# Patient Record
Sex: Male | Born: 1955 | Race: White | Hispanic: No | Marital: Married | Smoking: Former smoker
Health system: Southern US, Community
[De-identification: ages and names within clinical notes are randomized; demographics above are authoritative.]

## PROBLEM LIST (undated history)

## (undated) DIAGNOSIS — L409 Psoriasis, unspecified: Secondary | ICD-10-CM

## (undated) DIAGNOSIS — F419 Anxiety disorder, unspecified: Secondary | ICD-10-CM

## (undated) DIAGNOSIS — R7611 Nonspecific reaction to tuberculin skin test without active tuberculosis: Secondary | ICD-10-CM

## (undated) DIAGNOSIS — I10 Essential (primary) hypertension: Secondary | ICD-10-CM

## (undated) DIAGNOSIS — E7800 Pure hypercholesterolemia, unspecified: Secondary | ICD-10-CM

## (undated) DIAGNOSIS — K219 Gastro-esophageal reflux disease without esophagitis: Secondary | ICD-10-CM

## (undated) DIAGNOSIS — A159 Respiratory tuberculosis unspecified: Secondary | ICD-10-CM

## (undated) HISTORY — PX: VASECTOMY: SHX75

## (undated) HISTORY — PX: TONSILLECTOMY: SUR1361

## (undated) HISTORY — PX: ORAL MUCOCELE EXCISION: SHX2111

## (undated) HISTORY — PX: PILONIDAL CYST EXCISION: SHX744

---

## 2003-10-24 ENCOUNTER — Other Ambulatory Visit: Payer: Self-pay

## 2007-05-07 ENCOUNTER — Ambulatory Visit: Payer: Self-pay | Admitting: Gastroenterology

## 2014-09-04 ENCOUNTER — Other Ambulatory Visit: Payer: Self-pay | Admitting: Surgery

## 2014-09-04 DIAGNOSIS — M25561 Pain in right knee: Secondary | ICD-10-CM

## 2014-09-04 DIAGNOSIS — S83231A Complex tear of medial meniscus, current injury, right knee, initial encounter: Secondary | ICD-10-CM

## 2014-09-07 ENCOUNTER — Ambulatory Visit
Admission: RE | Admit: 2014-09-07 | Discharge: 2014-09-07 | Disposition: A | Discharge status: Home / Self Care | Payer: BC Managed Care – PPO | Source: Ambulatory Visit | Attending: Surgery | Admitting: Surgery

## 2014-09-07 DIAGNOSIS — R609 Edema, unspecified: Secondary | ICD-10-CM | POA: Diagnosis not present

## 2014-09-07 DIAGNOSIS — S83231A Complex tear of medial meniscus, current injury, right knee, initial encounter: Secondary | ICD-10-CM

## 2014-09-07 DIAGNOSIS — M25561 Pain in right knee: Secondary | ICD-10-CM | POA: Diagnosis present

## 2014-09-07 DIAGNOSIS — M1711 Unilateral primary osteoarthritis, right knee: Secondary | ICD-10-CM | POA: Diagnosis not present

## 2014-09-07 DIAGNOSIS — M23321 Other meniscus derangements, posterior horn of medial meniscus, right knee: Secondary | ICD-10-CM | POA: Insufficient documentation

## 2014-09-20 ENCOUNTER — Encounter: Payer: Self-pay | Admitting: *Deleted

## 2014-09-27 ENCOUNTER — Ambulatory Visit: Payer: BC Managed Care – PPO | Admitting: Anesthesiology

## 2014-09-27 ENCOUNTER — Ambulatory Visit
Admission: RE | Admit: 2014-09-27 | Discharge: 2014-09-27 | Disposition: A | Discharge status: Home / Self Care | Payer: BC Managed Care – PPO | Source: Ambulatory Visit | Attending: Surgery | Admitting: Surgery

## 2014-09-27 ENCOUNTER — Encounter: Payer: Self-pay | Admitting: *Deleted

## 2014-09-27 ENCOUNTER — Encounter
Admission: RE | Disposition: A | Discharge status: Home / Self Care | Payer: Self-pay | Source: Ambulatory Visit | Attending: Surgery

## 2014-09-27 DIAGNOSIS — F419 Anxiety disorder, unspecified: Secondary | ICD-10-CM | POA: Diagnosis not present

## 2014-09-27 DIAGNOSIS — K219 Gastro-esophageal reflux disease without esophagitis: Secondary | ICD-10-CM | POA: Insufficient documentation

## 2014-09-27 DIAGNOSIS — Z87891 Personal history of nicotine dependence: Secondary | ICD-10-CM | POA: Diagnosis not present

## 2014-09-27 DIAGNOSIS — M23203 Derangement of unspecified medial meniscus due to old tear or injury, right knee: Secondary | ICD-10-CM | POA: Diagnosis present

## 2014-09-27 DIAGNOSIS — M23221 Derangement of posterior horn of medial meniscus due to old tear or injury, right knee: Secondary | ICD-10-CM | POA: Diagnosis not present

## 2014-09-27 DIAGNOSIS — Z833 Family history of diabetes mellitus: Secondary | ICD-10-CM | POA: Insufficient documentation

## 2014-09-27 DIAGNOSIS — Z8349 Family history of other endocrine, nutritional and metabolic diseases: Secondary | ICD-10-CM | POA: Diagnosis not present

## 2014-09-27 DIAGNOSIS — Z818 Family history of other mental and behavioral disorders: Secondary | ICD-10-CM | POA: Diagnosis not present

## 2014-09-27 DIAGNOSIS — I10 Essential (primary) hypertension: Secondary | ICD-10-CM | POA: Insufficient documentation

## 2014-09-27 DIAGNOSIS — E785 Hyperlipidemia, unspecified: Secondary | ICD-10-CM | POA: Diagnosis not present

## 2014-09-27 DIAGNOSIS — Z825 Family history of asthma and other chronic lower respiratory diseases: Secondary | ICD-10-CM | POA: Diagnosis not present

## 2014-09-27 DIAGNOSIS — Z8249 Family history of ischemic heart disease and other diseases of the circulatory system: Secondary | ICD-10-CM | POA: Diagnosis not present

## 2014-09-27 DIAGNOSIS — Z79899 Other long term (current) drug therapy: Secondary | ICD-10-CM | POA: Diagnosis not present

## 2014-09-27 HISTORY — DX: Anxiety disorder, unspecified: F41.9

## 2014-09-27 HISTORY — DX: Nonspecific reaction to tuberculin skin test without active tuberculosis: R76.11

## 2014-09-27 HISTORY — PX: KNEE ARTHROSCOPY: SHX127

## 2014-09-27 HISTORY — DX: Essential (primary) hypertension: I10

## 2014-09-27 HISTORY — DX: Gastro-esophageal reflux disease without esophagitis: K21.9

## 2014-09-27 HISTORY — DX: Pure hypercholesterolemia, unspecified: E78.00

## 2014-09-27 SURGERY — ARTHROSCOPY, KNEE
Anesthesia: General | Laterality: Right | Wound class: Clean

## 2014-09-27 MED ORDER — OXYCODONE HCL 5 MG PO TABS: 5.0000 mg | ORAL_TABLET | ORAL | Status: DC | PRN

## 2014-09-27 MED ORDER — FENTANYL CITRATE (PF) 100 MCG/2ML IJ SOLN
INTRAMUSCULAR | Status: DC | PRN
  Administered 2014-09-27: 50 ug via INTRAVENOUS

## 2014-09-27 MED ORDER — ONDANSETRON HCL 4 MG/2ML IJ SOLN: 4.0000 mg | Freq: Once | INTRAMUSCULAR | Status: DC | PRN

## 2014-09-27 MED ORDER — OXYCODONE HCL 5 MG/5ML PO SOLN: 5.0000 mg | Freq: Once | ORAL | Status: DC | PRN

## 2014-09-27 MED ORDER — POTASSIUM CHLORIDE IN NACL 20-0.9 MEQ/L-% IV SOLN: INTRAVENOUS | Status: DC

## 2014-09-27 MED ORDER — ONDANSETRON HCL 4 MG/2ML IJ SOLN: 4.0000 mg | Freq: Four times a day (QID) | INTRAMUSCULAR | Status: DC | PRN

## 2014-09-27 MED ORDER — HYDROMORPHONE HCL 1 MG/ML IJ SOLN: 0.2500 mg | INTRAMUSCULAR | Status: DC | PRN

## 2014-09-27 MED ORDER — METOCLOPRAMIDE HCL 5 MG PO TABS: 5.0000 mg | ORAL_TABLET | Freq: Three times a day (TID) | ORAL | Status: DC | PRN

## 2014-09-27 MED ORDER — METOCLOPRAMIDE HCL 5 MG/ML IJ SOLN: 5.0000 mg | Freq: Three times a day (TID) | INTRAMUSCULAR | Status: DC | PRN

## 2014-09-27 MED ORDER — PROPOFOL 10 MG/ML IV BOLUS
INTRAVENOUS | Status: DC | PRN
  Administered 2014-09-27: 200 mg via INTRAVENOUS

## 2014-09-27 MED ORDER — LIDOCAINE HCL 1 % IJ SOLN
INTRAMUSCULAR | Status: DC | PRN
  Administered 2014-09-27: 60 mL via INTRAMUSCULAR

## 2014-09-27 MED ORDER — ONDANSETRON HCL 4 MG/2ML IJ SOLN
INTRAMUSCULAR | Status: DC | PRN
  Administered 2014-09-27: 4 mg via INTRAVENOUS

## 2014-09-27 MED ORDER — BUPIVACAINE-EPINEPHRINE (PF) 0.5% -1:200000 IJ SOLN
INTRAMUSCULAR | Status: DC | PRN
  Administered 2014-09-27: 12 mL

## 2014-09-27 MED ORDER — OXYCODONE HCL 5 MG PO TABS: 5.0000 mg | ORAL_TABLET | Freq: Once | ORAL | Status: DC | PRN

## 2014-09-27 MED ORDER — LIDOCAINE HCL (CARDIAC) 20 MG/ML IV SOLN
INTRAVENOUS | Status: DC | PRN
  Administered 2014-09-27: 40 mg via INTRATRACHEAL

## 2014-09-27 MED ORDER — LACTATED RINGERS IV SOLN
INTRAVENOUS | Status: DC
  Administered 2014-09-27: 14:00:00 via INTRAVENOUS

## 2014-09-27 MED ORDER — CEFAZOLIN SODIUM-DEXTROSE 2-3 GM-% IV SOLR
2.0000 g | Freq: Once | INTRAVENOUS | Status: AC
  Administered 2014-09-27: 2 g via INTRAVENOUS

## 2014-09-27 MED ORDER — ONDANSETRON HCL 4 MG PO TABS: 4.0000 mg | ORAL_TABLET | Freq: Four times a day (QID) | ORAL | Status: DC | PRN

## 2014-09-27 MED ORDER — DEXAMETHASONE SODIUM PHOSPHATE 4 MG/ML IJ SOLN
INTRAMUSCULAR | Status: DC | PRN
  Administered 2014-09-27: 8 mg via INTRAVENOUS

## 2014-09-27 MED ORDER — GLYCOPYRROLATE 0.2 MG/ML IJ SOLN
INTRAMUSCULAR | Status: DC | PRN
  Administered 2014-09-27: 0.2 mg via INTRAVENOUS

## 2014-09-27 SURGICAL SUPPLY — 41 items
BANDAGE ELASTIC 3 VELCRO NS (GAUZE/BANDAGES/DRESSINGS) IMPLANT
BANDAGE ELASTIC 4 VELCRO NS (GAUZE/BANDAGES/DRESSINGS) IMPLANT
BANDAGE ELASTIC 6 VELCRO NS (GAUZE/BANDAGES/DRESSINGS) ×3 IMPLANT
BLADE FULL RADIUS 3.5 (BLADE) ×3 IMPLANT
BLADE SHAVER 4.5X7 STR FR (MISCELLANEOUS) IMPLANT
BUR ACROMIONIZER 4.0 (BURR) IMPLANT
BUR BR 5.5 WIDE MOUTH (BURR) IMPLANT
CHLORAPREP W/TINT 26ML (MISCELLANEOUS) ×3 IMPLANT
COVER LIGHT HANDLE UNIVERSAL (MISCELLANEOUS) ×6 IMPLANT
CUFF TOURN SGL QUICK 24 (TOURNIQUET CUFF)
CUFF TOURN SGL QUICK 30 (MISCELLANEOUS) ×2
CUFF TOURN SGL QUICK 34 (TOURNIQUET CUFF)
CUFF TRNQT CYL 24X4X40X1 (TOURNIQUET CUFF) IMPLANT
CUFF TRNQT CYL 34X4X40X1 (TOURNIQUET CUFF) IMPLANT
CUFF TRNQT CYL LO 30X4X (MISCELLANEOUS) ×1 IMPLANT
DRAPE IMP U-DRAPE 54X76 (DRAPES) ×3 IMPLANT
GAUZE PETRO XEROFOAM 1X8 (MISCELLANEOUS) IMPLANT
GAUZE SPONGE 4X4 12PLY STRL (GAUZE/BANDAGES/DRESSINGS) ×3 IMPLANT
GLOVE BIO SURGEON STRL SZ8 (GLOVE) ×12 IMPLANT
GLOVE INDICATOR 8.0 STRL GRN (GLOVE) ×9 IMPLANT
GOWN STRL REUS W/ TWL LRG LVL3 (GOWN DISPOSABLE) ×1 IMPLANT
GOWN STRL REUS W/ TWL XL LVL3 (GOWN DISPOSABLE) ×1 IMPLANT
GOWN STRL REUS W/TWL LRG LVL3 (GOWN DISPOSABLE) ×2
GOWN STRL REUS W/TWL XL LVL3 (GOWN DISPOSABLE) ×2
IV LACTATED RINGER IRRG 3000ML (IV SOLUTION) ×4
IV LR IRRIG 3000ML ARTHROMATIC (IV SOLUTION) ×2 IMPLANT
MANIFOLD 4PT FOR NEPTUNE1 (MISCELLANEOUS) ×3 IMPLANT
NDL MAYO CATGUT SZ5 (NEEDLE)
NDL SUT 5 .5 CRC TPR PNT MAYO (NEEDLE) IMPLANT
NEEDLE HYPO 21X1.5 SAFETY (NEEDLE) ×6 IMPLANT
PACK ARTHROSCOPY KNEE (MISCELLANEOUS) ×3 IMPLANT
PAD GROUND ADULT SPLIT (MISCELLANEOUS) IMPLANT
STAPLER SKIN PROX 35W (STAPLE) IMPLANT
STRAP BODY AND KNEE 60X3 (MISCELLANEOUS) ×3 IMPLANT
SUT ETHIBOND 0 MO6 C/R (SUTURE) IMPLANT
SUT PROLENE 4 0 PS 2 18 (SUTURE) ×3 IMPLANT
SUT VIC AB 2-0 CT1 27 (SUTURE)
SUT VIC AB 2-0 CT1 TAPERPNT 27 (SUTURE) IMPLANT
SYR 50ML LL SCALE MARK (SYRINGE) ×3 IMPLANT
TUBING ARTHRO INFLOW-ONLY STRL (TUBING) ×3 IMPLANT
WAND HAND CNTRL MULTIVAC 90 (MISCELLANEOUS) IMPLANT

## 2014-09-27 NOTE — Discharge Instructions (Signed)
General Anesthesia, Care After °Refer to this sheet in the next few weeks. These instructions provide you with information on caring for yourself after your procedure. Your health care provider may also give you more specific instructions. Your treatment has been planned according to current medical practices, but problems sometimes occur. Call your health care provider if you have any problems or questions after your procedure. °WHAT TO EXPECT AFTER THE PROCEDURE °After the procedure, it is typical to experience: °· Sleepiness. °· Nausea and vomiting. °HOME CARE INSTRUCTIONS °· For the first 24 hours after general anesthesia: °¨ Have a responsible person with you. °¨ Do not drive a car. If you are alone, do not take public transportation. °¨ Do not drink alcohol. °¨ Do not take medicine that has not been prescribed by your health care provider. °¨ Do not sign important papers or make important decisions. °¨ You may resume a normal diet and activities as directed by your health care provider. °· Change bandages (dressings) as directed. °· If you have questions or problems that seem related to general anesthesia, call the hospital and ask for the anesthetist or anesthesiologist on call. °SEEK MEDICAL CARE IF: °· You have nausea and vomiting that continue the day after anesthesia. °· You develop a rash. °SEEK IMMEDIATE MEDICAL CARE IF:  °· You have difficulty breathing. °· You have chest pain. °· You have any allergic problems. °Document Released: 05/19/2000 Document Revised: 02/15/2013 Document Reviewed: 08/26/2012 °[employer]® Patient Information ©2015 [employer]. This information is not intended to replace advice given to you by your health care provider. Make sure you discuss any questions you have with your health care provider. ° °Keep dressing dry and intact.  °May shower after dressing changed on post-op day #4 (Sunday).  °Cover staples/sutures with Band-Aids after drying off. °Apply ice frequently to  knee. °May weight-bear as tolerated - use crutches or walker as needed. °Follow-up in 10-14 days or as scheduled. °

## 2014-09-27 NOTE — Op Note (Signed)
09/27/2014  2:32 PM  Patient:   Shaun Howe  Pre-Op Diagnosis:   Medial meniscus tear, right knee.  Postoperative diagnosis:   Same  Procedure:   Arthroscopic debridement and partial medial meniscectomy, right knee.  Surgeon:   Maryagnes Amos, M.D.  Anesthesia:   General LMA.  Findings:   As above. The lateral meniscus was in excellent condition, as were the anterior and posterior cruciate ligaments. The articular surfaces of the patella, the femur, and the tibia all were in satisfactory condition.  Complications:   None.  EBL:   5 cc.  Total fluids:   500 cc of crystalloid.  Tourniquet time:   None  Drains:   None  Closure:   4-0 Prolene interrupted sutures.  Brief clinical note:   The patient is a 59 year old male with a several month history of medial sided right knee pain. His symptoms have persisted despite medications, activity modification, etc. His history and examination were consistent with a lateral meniscus tear, confirmed by MRI scan. The patient presents at this time for arthroscopy, debridement, and partial medial meniscectomy.  Procedure:   The patient was brought into the operating room and lain in the supine position. After adequate general laryngal mask anesthesia was obtained, a timeout was performed to verify the appropriate side. The patient's right knee was injected sterilely using a solution of 30 cc of 1% lidocaine and 30 cc of 0.5% Sensorcaine with epinephrine. The right lower extremity was prepped with DuraPrep solution before being draped sterilely. Preoperative antibiotics were administered. The expected portal sites were injected with 0.5% Sensorcaine with epinephrine before the camera was placed in the anterolateral portal and instrumentation performed through the anteromedial portal. The knee was sequentially examined beginning in the suprapatellar pouch, then progressing to the patellofemoral space, the medial gutter compartment, the notch, and  finally the lateral compartment and gutter. The findings were as described above. Abundant reactive synovial tissues anteriorly were debrided using the full-radius resector in order to improve visualization. The complex degenerative tear involving the posterior portion of the medial meniscus was debrided back to stable margins using combination of the mini-munchers and full radius resector. Subsequent probing of the remaining rim demonstrated excellent stability. Laterally, the meniscus was intact to probing, as were the anterior and posterior cruciate ligaments. The instruments were removed from the joint after suctioning the excess fluid. The portal sites were closed using 4-0 Prolene interrupted sutures before a sterile bulky dressing was applied to the knee. The patient was then awakened, extubated, and returned to the recovery room in satisfactory condition after tolerating the procedure well.

## 2014-09-27 NOTE — H&P (Signed)
Paper H&P to be scanned into permanent record. H&P reviewed. No changes. 

## 2014-09-27 NOTE — Anesthesia Postprocedure Evaluation (Signed)
  Anesthesia Post-op Note  Patient: Shaun Howe  Procedure(s) Performed: Procedure(s): ARTHROSCOPY KNEE WITH DEBRIDEMENT AND PARTIAL MENISECTOMY (Right)  Anesthesia type:General  Patient location: PACU  Post pain: Pain level controlled  Post assessment: Post-op Vital signs reviewed, Patient's Cardiovascular Status Stable, Respiratory Function Stable, Patent Airway and No signs of Nausea or vomiting  Post vital signs: Reviewed and stable  Last Vitals:  Filed Vitals:   09/27/14 1436  BP:   Pulse:   Temp: 36.7 C  Resp: 15    Level of consciousness: awake, alert  and patient cooperative  Complications: No apparent anesthesia complications

## 2014-09-27 NOTE — Anesthesia Procedure Notes (Signed)
Procedure Name: LMA Insertion Date/Time: 09/27/2014 1:55 PM Performed by: Andee Poles Pre-anesthesia Checklist: Patient identified, Emergency Drugs available, Suction available, Timeout performed and Patient being monitored Patient Re-evaluated:Patient Re-evaluated prior to inductionOxygen Delivery Method: Circle system utilized Preoxygenation: Pre-oxygenation with 100% oxygen Intubation Type: IV induction LMA: LMA inserted LMA Size: 5.0 Number of attempts: 1 Placement Confirmation: positive ETCO2 and breath sounds checked- equal and bilateral Tube secured with: Tape

## 2014-09-27 NOTE — Anesthesia Preprocedure Evaluation (Signed)
Anesthesia Evaluation  Patient identified by MRN, date of birth, ID band Patient awake    Reviewed: Allergy & Precautions, NPO status , Patient's Chart, lab work & pertinent test results  Airway Mallampati: II  TM Distance: >3 FB Neck ROM: Full    Dental   Pulmonary former smoker,    Pulmonary exam normal       Cardiovascular hypertension, Normal cardiovascular exam    Neuro/Psych Anxiety    GI/Hepatic GERD-  ,  Endo/Other    Renal/GU      Musculoskeletal   Abdominal   Peds  Hematology   Anesthesia Other Findings   Reproductive/Obstetrics                             Anesthesia Physical Anesthesia Plan  ASA: II  Anesthesia Plan: General   Post-op Pain Management:    Induction: Intravenous  Airway Management Planned: LMA  Additional Equipment:   Intra-op Plan:   Post-operative Plan: Extubation in OR  Informed Consent: I have reviewed the patients History and Physical, chart, labs and discussed the procedure including the risks, benefits and alternatives for the proposed anesthesia with the patient or authorized representative who has indicated his/her understanding and acceptance.     Plan Discussed with: CRNA  Anesthesia Plan Comments:         Anesthesia Quick Evaluation

## 2014-09-27 NOTE — Transfer of Care (Signed)
Immediate Anesthesia Transfer of Care Note  Patient: Shaun Howe  Procedure(s) Performed: Procedure(s): ARTHROSCOPY KNEE WITH DEBRIDEMENT AND PARTIAL MENISECTOMY (Right)  Patient Location: PACU  Anesthesia Type: General  Level of Consciousness: awake, alert  and patient cooperative  Airway and Oxygen Therapy: Patient Spontanous Breathing and Patient connected to supplemental oxygen  Post-op Assessment: Post-op Vital signs reviewed, Patient's Cardiovascular Status Stable, Respiratory Function Stable, Patent Airway and No signs of Nausea or vomiting  Post-op Vital Signs: Reviewed and stable  Complications: No apparent anesthesia complications

## 2014-09-28 ENCOUNTER — Encounter: Payer: Self-pay | Admitting: Surgery

## 2016-05-11 IMAGING — MR MR KNEE*R* W/O CM
6 series · 40 of 40 positions shown · non-contrast
Comparison: Report only from remote prior study dated 04/07/2003

CLINICAL DATA: Medial knee pain with swelling following twisting
injury 3.5 months ago. Symptoms worse with standing and stair
climbing. No previous relevant surgery. Initial encounter.

EXAM:
MRI OF THE RIGHT KNEE WITHOUT CONTRAST
TECHNIQUE: Multiplanar, multisequence MR imaging of the knee was performed. No
intravenous contrast was administered.

[Series 3: PD fat-sat · axial · 3.0mm · 0.62mm/px · z∈[-53,+75]mm · 9 of 40 slices shown (1 of 4)]
[im 1/40]
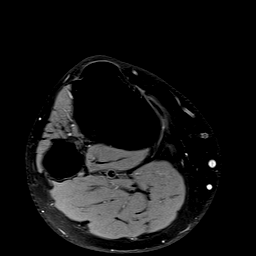
[im 5/40]
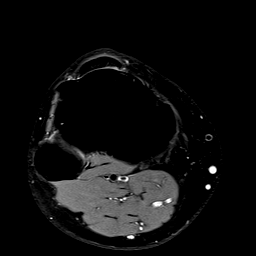
[im 10/40]
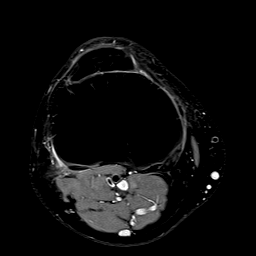
[im 15/40]
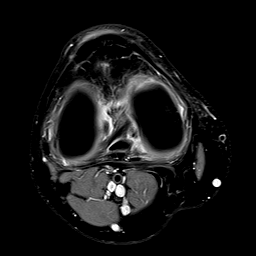
[im 20/40]
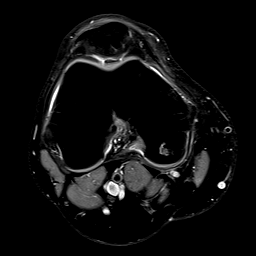
[im 25/40]
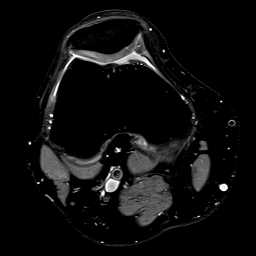
[im 30/40]
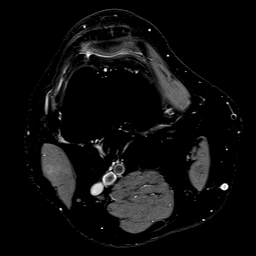
[im 35/40]
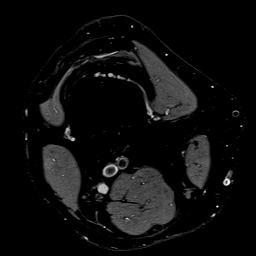
[im 40/40]
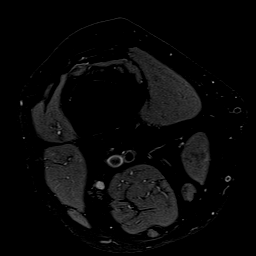

[Series 4: T1 · coronal · 3.0mm · 0.62mm/px · 7 of 34 slices shown]
[im 1/34]
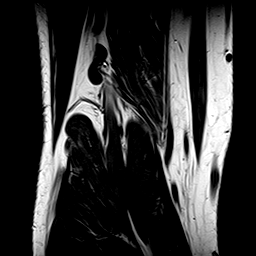
[im 6/34]
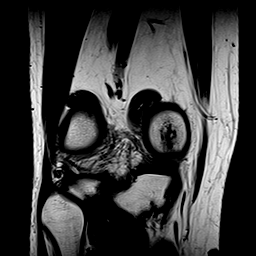
[im 12/34]
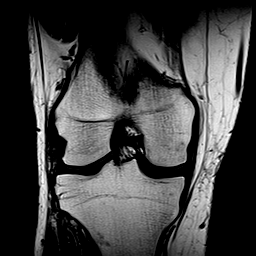
[im 17/34]
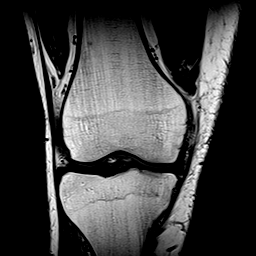
[im 23/34]
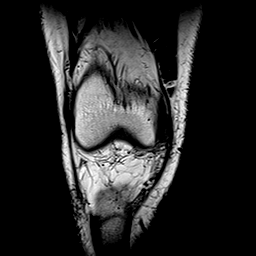
[im 28/34]
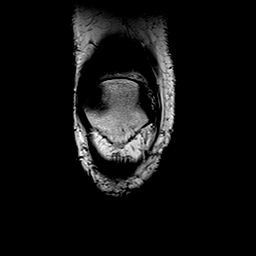
[im 34/34]
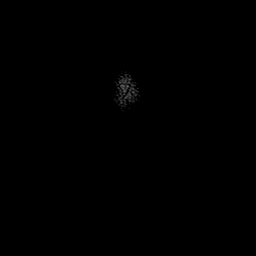

[Series 5: PD fat-sat · sagittal · 3.0mm · 0.62mm/px · 8 of 35 slices shown (2 of 4)]
[im 1/35]
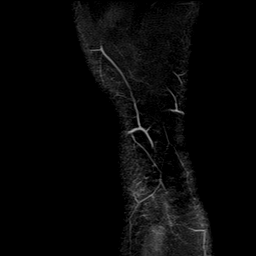
[im 5/35]
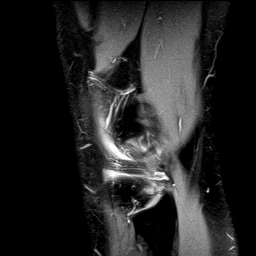
[im 10/35]
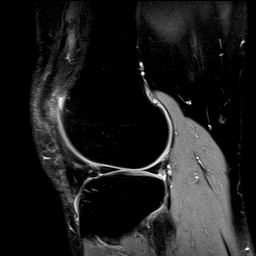
[im 15/35]
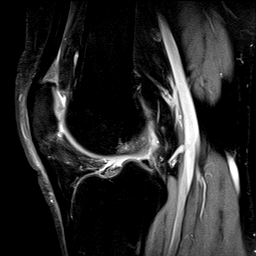
[im 20/35]
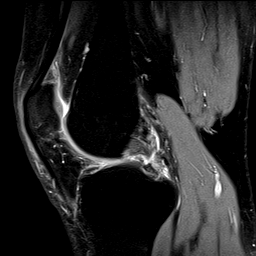
[im 25/35]
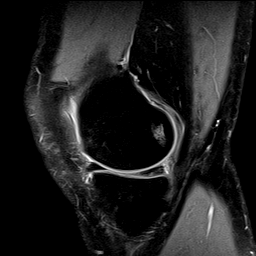
[im 30/35]
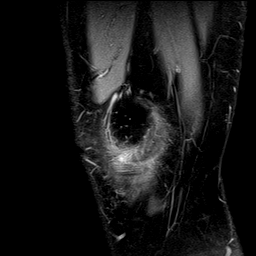
[im 35/35]
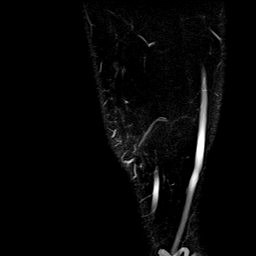

[Series 6: T2 fat-sat · coronal · 3.0mm · 0.31mm/px · 7 of 34 slices shown]
[im 1/34]
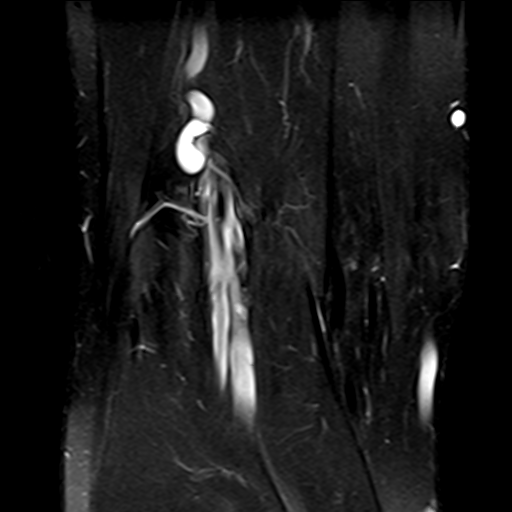
[im 6/34]
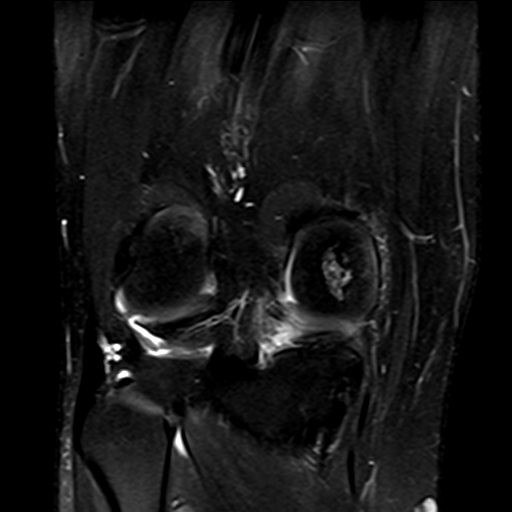
[im 12/34]
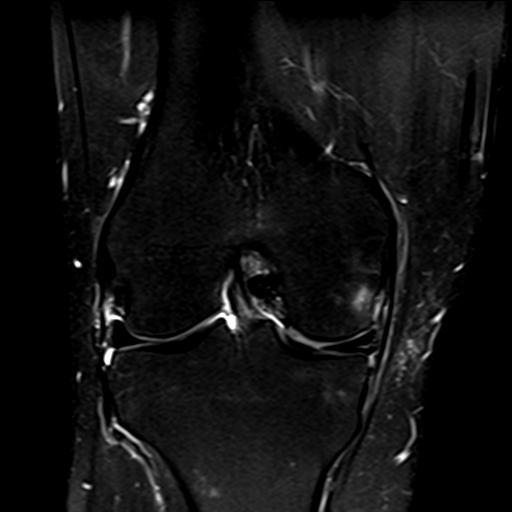
[im 17/34]
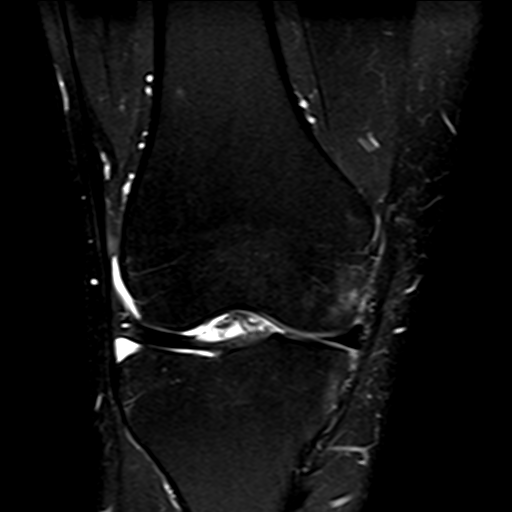
[im 23/34]
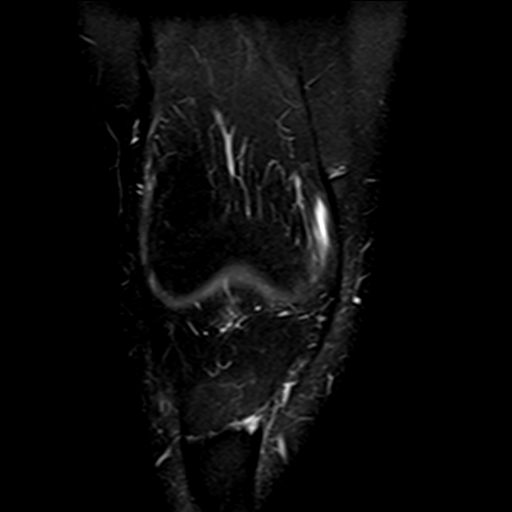
[im 28/34]
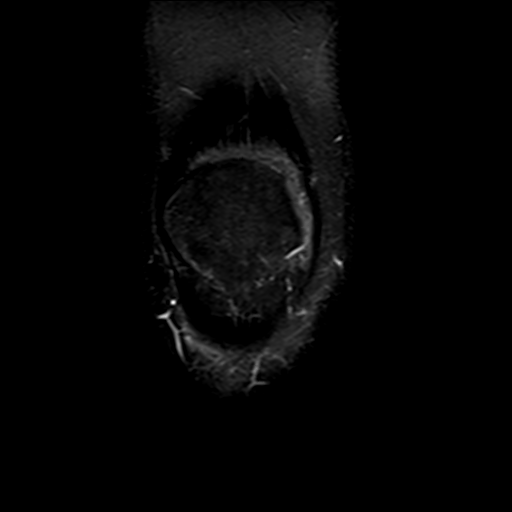
[im 34/34]
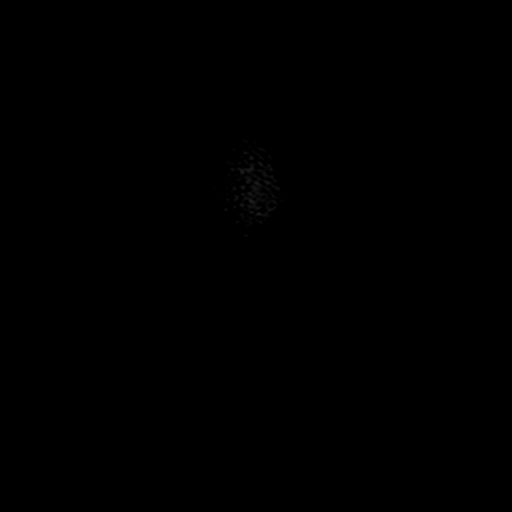

[Series 7: PD fat-sat · coronal · 3.0mm · 0.50mm/px · 7 of 34 slices shown (3 of 4)]
[im 1/34]
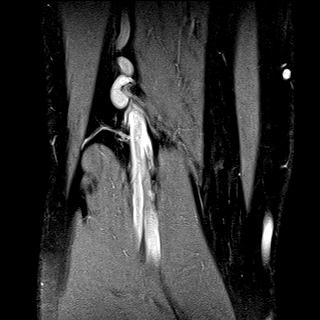
[im 6/34]
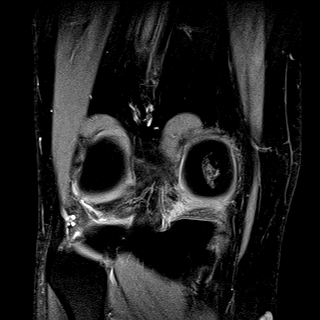
[im 12/34]
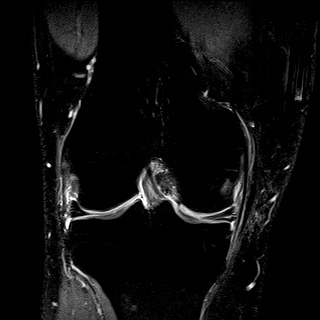
[im 17/34]
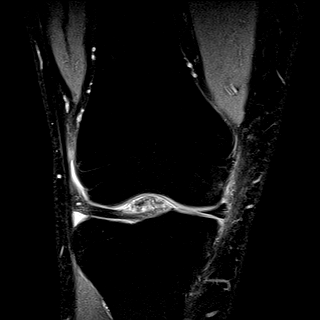
[im 23/34]
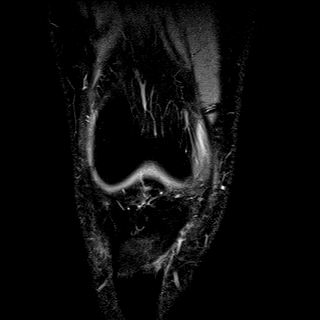
[im 28/34]
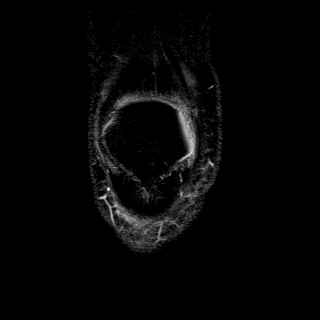
[im 34/34]
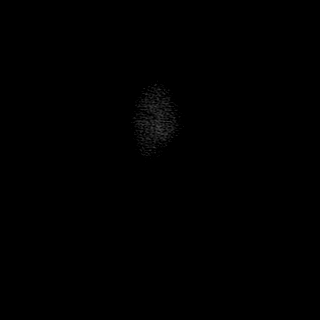

[Series 8: PD fat-sat · oblique · 2.0mm · 0.62mm/px · 2 of 7 slices shown (4 of 4)]
[im 1/7]
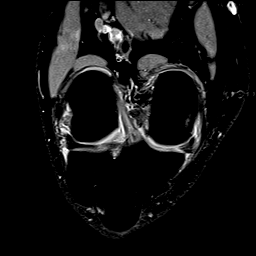
[im 7/7]
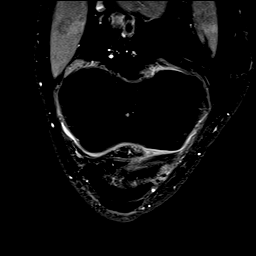

[40 of 40 positions shown; findings below may reference images not displayed]

FINDINGS: MENISCI

Medial meniscus: There is degenerative signal throughout the
posterior horn and body. There is minimal superior articular surface
irregularity in the posterior horn, best seen on sagittal image
number 26. This could reflect a small meniscal tear. There is no
displaced meniscal fragment.

Lateral meniscus:  Intact with normal morphology.

LIGAMENTS

Cruciates:  Intact.

Collaterals:  Intact with mild MCL degeneration.

CARTILAGE

Patellofemoral: Minimal fibrillation of the cartilage over the
medial facet. No subchondral signal abnormality.

Medial: Mild chondral thinning and surface irregularity without
focal defect.

Lateral:  Preserved.

OTHER

Joint:  No significant joint effusion.

Popliteal Fossa:  Unremarkable. No significant Baker's cyst.

Extensor Mechanism: Intact. There is minimal prepatellar
subcutaneous edema. There is also mild edema within the quadriceps
fat pad and the infrapatellar fat.

Bones: No significant extra-articular osseous findings. Probable
chondroid lesion versus small chronic bone infarct posteriorly in
the medial femoral condyle.
IMPRESSION: 1. Degenerative signal throughout the posterior horn and body of the
medial meniscus. Possible small superior articular surface tear in
the posterior horn without displaced meniscal fragment.
2. Minimal patellar and medial compartment degenerative chondrosis.
No acute osseous findings.
3. Mild anterior soft tissue edema without significant joint
effusion.
4. Intact lateral meniscus, cruciate and collateral ligaments.

## 2017-04-15 ENCOUNTER — Ambulatory Visit: Payer: Self-pay | Admitting: Orthopedic Surgery

## 2017-04-27 ENCOUNTER — Other Ambulatory Visit: Payer: Self-pay

## 2017-04-27 ENCOUNTER — Encounter
Admission: RE | Admit: 2017-04-27 | Discharge: 2017-04-27 | Disposition: A | Discharge status: Home / Self Care | Payer: BC Managed Care – PPO | Source: Ambulatory Visit | Attending: Orthopedic Surgery | Admitting: Orthopedic Surgery

## 2017-04-27 HISTORY — DX: Respiratory tuberculosis unspecified: A15.9

## 2017-04-27 HISTORY — DX: Psoriasis, unspecified: L40.9

## 2017-04-27 NOTE — Patient Instructions (Signed)
Your procedure is scheduled on: 05-04-17 MONDAY Report to Same Day Surgery 2nd floor medical mall [facility] Entrance-take elevator on left to 2nd floor.  Check in with surgery information desk.) To find out your arrival time please call [phone] between 1PM - 3PM on 05-01-17 FRIDAY  Remember: Instructions that are not followed completely may result in serious medical risk, up to and including death, or upon the discretion of your surgeon and anesthesiologist your surgery may need to be rescheduled.    _x___ 1. Do not eat food after midnight the night before your procedure. NO GUM OR CANDY AFTER MIDNIGHT.  You may drink clear liquids up to 2 hours before you are scheduled to arrive at the hospital for your procedure.  Do not drink clear liquids within 2 hours of your scheduled arrival to the hospital.  Clear liquids include  --Water or Apple juice without pulp  --Clear carbohydrate beverage such as ClearFast or Gatorade  --Black Coffee or Clear Tea (No milk, no creamers, do not add anything to the coffee or Tea     __x__ 2. No Alcohol for 24 hours before or after surgery.   __x__3. No Smoking or e-cigarettes for 24 prior to surgery.  Do not use any chewable tobacco products for at least 6 hour prior to surgery   ____  4. Bring all medications with you on the day of surgery if instructed.    __x__ 5. Notify your doctor if there is any change in your medical condition     (cold, fever, infections).    x___6. On the morning of surgery brush your teeth with toothpaste and water.  You may rinse your mouth with mouth wash if you wish.  Do not swallow any toothpaste or mouthwash.   Do not wear jewelry, make-up, hairpins, clips or nail polish.  Do not wear lotions, powders, or perfumes. You may wear deodorant.  Do not shave 48 hours prior to surgery. Men may shave face and neck.  Do not bring valuables to the hospital.    [facility] is not responsible for any belongings or  valuables.               Contacts, dentures or bridgework may not be worn into surgery.  Leave your suitcase in the car. After surgery it may be brought to your room.  For patients admitted to the hospital, discharge time is determined by your treatment team.  _  Patients discharged the day of surgery will not be allowed to drive home.  You will need someone to drive you home and stay with you the night of your procedure.    Please read over the following fact sheets that you were given:   [facility] Preparing for Surgery and or MRSA Information   _x___ TAKE THE FOLLOWING MEDICATION THE MORNING OF SURGERY WITH A SMALL SIP OF WATER. These include:  1. LIPITOR (ATORVASTATIN)  2. KLONOPIN (CLONAZEPAM)  3. VIIBRYD  4. ZANTAC (RANITIDINE)  5. TAKE A ZANTAC Sunday NIGHT BEFORE BED  6.  ____Fleets enema or Magnesium Citrate as directed.   _x___ Use CHG Soap or sage wipes as directed on instruction sheet   ____ Use inhalers on the day of surgery and bring to hospital day of surgery  ____ Stop Metformin and Janumet 2 days prior to surgery.    ____ Take 1/2 of usual insulin dose the night before surgery and none on the morning surgery.   ____ Follow recommendations from Cardiologist,  Pulmonologist or PCP regarding stopping Aspirin, Coumadin, Plavix ,Eliquis, Effient, or Pradaxa, and Pletal.  X____Stop Anti-inflammatories such as Advil, Aleve, IBUPROFEN, Motrin, Naproxen, Naprosyn, Goodies powders or aspirin products NOW-OK to take Tylenol OR TRAMADOL IF NEEDED   ____ Stop supplements until after surgery.     ____ Bring C-Pap to the hospital.

## 2017-04-28 ENCOUNTER — Encounter
Admission: RE | Admit: 2017-04-28 | Discharge: 2017-04-28 | Disposition: A | Discharge status: Home / Self Care | Payer: BC Managed Care – PPO | Source: Ambulatory Visit | Attending: Orthopedic Surgery | Admitting: Orthopedic Surgery

## 2017-04-28 DIAGNOSIS — I4510 Unspecified right bundle-branch block: Secondary | ICD-10-CM | POA: Insufficient documentation

## 2017-04-28 DIAGNOSIS — Z01818 Encounter for other preprocedural examination: Secondary | ICD-10-CM | POA: Diagnosis not present

## 2017-04-28 DIAGNOSIS — I10 Essential (primary) hypertension: Secondary | ICD-10-CM | POA: Diagnosis not present

## 2017-04-28 LAB — POTASSIUM: POTASSIUM: 3.6 mmol/L (ref 3.5–5.1)

## 2017-05-01 NOTE — Pre-Procedure Instructions (Signed)
Medical clearance on chart from pcp-low risk 

## 2017-05-03 MED ORDER — CEFAZOLIN SODIUM-DEXTROSE 2-4 GM/100ML-% IV SOLN
2.0000 g | INTRAVENOUS | Status: AC
  Administered 2017-05-04: 2 g via INTRAVENOUS

## 2017-05-04 ENCOUNTER — Ambulatory Visit
Admission: RE | Admit: 2017-05-04 | Discharge: 2017-05-04 | Disposition: A | Discharge status: Home / Self Care | Payer: BC Managed Care – PPO | Source: Ambulatory Visit | Attending: Orthopedic Surgery | Admitting: Orthopedic Surgery

## 2017-05-04 ENCOUNTER — Encounter
Admission: RE | Disposition: A | Discharge status: Home / Self Care | Payer: Self-pay | Source: Ambulatory Visit | Attending: Orthopedic Surgery

## 2017-05-04 ENCOUNTER — Ambulatory Visit: Payer: BC Managed Care – PPO | Admitting: Certified Registered"

## 2017-05-04 ENCOUNTER — Encounter: Payer: Self-pay | Admitting: Emergency Medicine

## 2017-05-04 DIAGNOSIS — Z79899 Other long term (current) drug therapy: Secondary | ICD-10-CM | POA: Diagnosis not present

## 2017-05-04 DIAGNOSIS — M659 Synovitis and tenosynovitis, unspecified: Secondary | ICD-10-CM | POA: Diagnosis not present

## 2017-05-04 DIAGNOSIS — X58XXXA Exposure to other specified factors, initial encounter: Secondary | ICD-10-CM | POA: Insufficient documentation

## 2017-05-04 DIAGNOSIS — Z87891 Personal history of nicotine dependence: Secondary | ICD-10-CM | POA: Insufficient documentation

## 2017-05-04 DIAGNOSIS — M94262 Chondromalacia, left knee: Secondary | ICD-10-CM | POA: Insufficient documentation

## 2017-05-04 DIAGNOSIS — I10 Essential (primary) hypertension: Secondary | ICD-10-CM | POA: Insufficient documentation

## 2017-05-04 DIAGNOSIS — S83242A Other tear of medial meniscus, current injury, left knee, initial encounter: Secondary | ICD-10-CM | POA: Diagnosis not present

## 2017-05-04 DIAGNOSIS — Z791 Long term (current) use of non-steroidal anti-inflammatories (NSAID): Secondary | ICD-10-CM | POA: Diagnosis not present

## 2017-05-04 DIAGNOSIS — E7800 Pure hypercholesterolemia, unspecified: Secondary | ICD-10-CM | POA: Insufficient documentation

## 2017-05-04 DIAGNOSIS — M25562 Pain in left knee: Secondary | ICD-10-CM | POA: Diagnosis present

## 2017-05-04 HISTORY — PX: KNEE ARTHROSCOPY WITH MEDIAL MENISECTOMY: SHX5651

## 2017-05-04 SURGERY — ARTHROSCOPY, KNEE, WITH MEDIAL MENISCECTOMY
Anesthesia: General | Site: Knee | Laterality: Left | Wound class: Clean

## 2017-05-04 MED ORDER — OXYCODONE-ACETAMINOPHEN 5-325 MG PO TABS: 1.0000 | ORAL_TABLET | ORAL | Status: DC | PRN

## 2017-05-04 MED ORDER — DOCUSATE SODIUM 100 MG PO CAPS: 100.0000 mg | ORAL_CAPSULE | Freq: Every day | ORAL | 2 refills | Status: AC | PRN

## 2017-05-04 MED ORDER — BUPIVACAINE-EPINEPHRINE (PF) 0.25% -1:200000 IJ SOLN
INTRAMUSCULAR | Status: DC | PRN
  Administered 2017-05-04: 8 mL

## 2017-05-04 MED ORDER — DEXAMETHASONE SODIUM PHOSPHATE 10 MG/ML IJ SOLN
INTRAMUSCULAR | Status: DC | PRN
  Administered 2017-05-04: 10 mg via INTRAVENOUS

## 2017-05-04 MED ORDER — FENTANYL CITRATE (PF) 100 MCG/2ML IJ SOLN
INTRAMUSCULAR | Status: AC
  Administered 2017-05-04: 50 ug via INTRAVENOUS
  Filled 2017-05-04: qty 2

## 2017-05-04 MED ORDER — MORPHINE SULFATE (PF) 4 MG/ML IV SOLN: 1.0000 mg | INTRAVENOUS | Status: DC | PRN

## 2017-05-04 MED ORDER — ROPIVACAINE HCL 10 MG/ML IJ SOLN
INTRAMUSCULAR | Status: DC | PRN
  Administered 2017-05-04: 10 mL

## 2017-05-04 MED ORDER — ONDANSETRON HCL 4 MG/2ML IJ SOLN
INTRAMUSCULAR | Status: DC | PRN
Start: 2017-05-04 — End: 2017-05-04
  Administered 2017-05-04: 4 mg via INTRAVENOUS

## 2017-05-04 MED ORDER — PROPOFOL 500 MG/50ML IV EMUL
INTRAVENOUS | Status: AC
  Filled 2017-05-04: qty 50

## 2017-05-04 MED ORDER — FENTANYL CITRATE (PF) 100 MCG/2ML IJ SOLN
25.0000 ug | INTRAMUSCULAR | Status: DC | PRN
  Administered 2017-05-04 (×2): 50 ug via INTRAVENOUS

## 2017-05-04 MED ORDER — METHYLPREDNISOLONE ACETATE 40 MG/ML INJ SUSP (RADIOLOG
INTRAMUSCULAR | Status: DC | PRN
  Administered 2017-05-04: 40 mg

## 2017-05-04 MED ORDER — LIDOCAINE HCL (CARDIAC) 20 MG/ML IV SOLN
INTRAVENOUS | Status: DC | PRN
  Administered 2017-05-04: 100 mg via INTRAVENOUS

## 2017-05-04 MED ORDER — ACETAMINOPHEN 500 MG PO TABS
ORAL_TABLET | ORAL | Status: AC
  Administered 2017-05-04: 1000 mg via ORAL
  Filled 2017-05-04: qty 2

## 2017-05-04 MED ORDER — ONDANSETRON HCL 4 MG PO TABS: 4.0000 mg | ORAL_TABLET | Freq: Four times a day (QID) | ORAL | Status: DC | PRN

## 2017-05-04 MED ORDER — EPINEPHRINE 30 MG/30ML IJ SOLN
INTRAMUSCULAR | Status: AC
  Filled 2017-05-04: qty 1

## 2017-05-04 MED ORDER — LACTATED RINGERS IV SOLN
INTRAVENOUS | Status: DC
  Administered 2017-05-04: 09:00:00 via INTRAVENOUS

## 2017-05-04 MED ORDER — ONDANSETRON HCL 4 MG/2ML IJ SOLN: 4.0000 mg | Freq: Four times a day (QID) | INTRAMUSCULAR | Status: DC | PRN

## 2017-05-04 MED ORDER — CHLORHEXIDINE GLUCONATE 4 % EX LIQD: 60.0000 mL | Freq: Once | CUTANEOUS | Status: DC

## 2017-05-04 MED ORDER — FENTANYL CITRATE (PF) 100 MCG/2ML IJ SOLN
INTRAMUSCULAR | Status: AC
  Filled 2017-05-04: qty 2

## 2017-05-04 MED ORDER — PROMETHAZINE HCL 25 MG/ML IJ SOLN: 6.2500 mg | INTRAMUSCULAR | Status: DC | PRN

## 2017-05-04 MED ORDER — ROPIVACAINE HCL 5 MG/ML IJ SOLN
INTRAMUSCULAR | Status: AC
  Filled 2017-05-04: qty 20

## 2017-05-04 MED ORDER — EPINEPHRINE 30 MG/30ML IJ SOLN
INTRAMUSCULAR | Status: DC | PRN
  Administered 2017-05-04: 4 mL

## 2017-05-04 MED ORDER — MIDAZOLAM HCL 2 MG/2ML IJ SOLN
INTRAMUSCULAR | Status: AC
  Filled 2017-05-04: qty 2

## 2017-05-04 MED ORDER — LACTATED RINGERS IV SOLN: INTRAVENOUS | Status: DC

## 2017-05-04 MED ORDER — MORPHINE SULFATE (PF) 4 MG/ML IV SOLN
INTRAVENOUS | Status: DC | PRN
  Administered 2017-05-04: 4 mg

## 2017-05-04 MED ORDER — HYDROCODONE-ACETAMINOPHEN 5-325 MG PO TABS: 1.0000 | ORAL_TABLET | ORAL | 0 refills | Status: AC | PRN

## 2017-05-04 MED ORDER — BUPIVACAINE-EPINEPHRINE (PF) 0.25% -1:200000 IJ SOLN
INTRAMUSCULAR | Status: AC
  Filled 2017-05-04: qty 30

## 2017-05-04 MED ORDER — FENTANYL CITRATE (PF) 100 MCG/2ML IJ SOLN
INTRAMUSCULAR | Status: DC | PRN
  Administered 2017-05-04 (×2): 50 ug via INTRAVENOUS

## 2017-05-04 MED ORDER — ACETAMINOPHEN 500 MG PO TABS
1000.0000 mg | ORAL_TABLET | Freq: Once | ORAL | Status: AC
  Administered 2017-05-04: 1000 mg via ORAL

## 2017-05-04 MED ORDER — CEFAZOLIN SODIUM-DEXTROSE 2-4 GM/100ML-% IV SOLN
INTRAVENOUS | Status: AC
  Filled 2017-05-04: qty 100

## 2017-05-04 MED ORDER — GLYCOPYRROLATE 0.2 MG/ML IJ SOLN
INTRAMUSCULAR | Status: DC | PRN
  Administered 2017-05-04: 0.2 mg via INTRAVENOUS

## 2017-05-04 MED ORDER — METOCLOPRAMIDE HCL 10 MG PO TABS: 5.0000 mg | ORAL_TABLET | Freq: Three times a day (TID) | ORAL | Status: DC | PRN

## 2017-05-04 MED ORDER — MORPHINE SULFATE (PF) 4 MG/ML IV SOLN
INTRAVENOUS | Status: AC
  Filled 2017-05-04: qty 1

## 2017-05-04 MED ORDER — METOCLOPRAMIDE HCL 5 MG/ML IJ SOLN: 5.0000 mg | Freq: Three times a day (TID) | INTRAMUSCULAR | Status: DC | PRN

## 2017-05-04 MED ORDER — LACTATED RINGERS IV SOLN
INTRAVENOUS | Status: DC
  Administered 2017-05-04: 11:00:00 via INTRAVENOUS

## 2017-05-04 MED ORDER — PROPOFOL 10 MG/ML IV BOLUS
INTRAVENOUS | Status: DC | PRN
  Administered 2017-05-04: 200 mg via INTRAVENOUS

## 2017-05-04 MED ORDER — GABAPENTIN 300 MG PO CAPS
ORAL_CAPSULE | ORAL | Status: AC
  Administered 2017-05-04: 300 mg via ORAL
  Filled 2017-05-04: qty 1

## 2017-05-04 MED ORDER — PROPOFOL 10 MG/ML IV BOLUS
INTRAVENOUS | Status: AC
  Filled 2017-05-04: qty 20

## 2017-05-04 MED ORDER — GABAPENTIN 300 MG PO CAPS
300.0000 mg | ORAL_CAPSULE | Freq: Once | ORAL | Status: AC
  Administered 2017-05-04: 300 mg via ORAL

## 2017-05-04 MED ORDER — METHYLPREDNISOLONE ACETATE 40 MG/ML IJ SUSP
INTRAMUSCULAR | Status: AC
  Filled 2017-05-04: qty 1

## 2017-05-04 SURGICAL SUPPLY — 34 items
ADAPTER IRRIG TUBE 2 SPIKE SOL (ADAPTER) ×6 IMPLANT
BLADE FULL RADIUS 3.5 (BLADE) IMPLANT
BLADE INCISOR PLUS 4.5 (BLADE) ×3 IMPLANT
BLADE SHAVER 4.5 DBL SERAT CV (CUTTER) ×3 IMPLANT
BLADE SURG SZ11 CARB STEEL (BLADE) ×3 IMPLANT
BRUSH SCRUB EZ  4% CHG (MISCELLANEOUS) ×4
BRUSH SCRUB EZ 4% CHG (MISCELLANEOUS) ×2 IMPLANT
CHLORAPREP W/TINT 26ML (MISCELLANEOUS) ×3 IMPLANT
COOLER POLAR GLACIER W/PUMP (MISCELLANEOUS) IMPLANT
DRAPE IMP U-DRAPE 54X76 (DRAPES) ×3 IMPLANT
GAUZE PETRO XEROFOAM 1X8 (MISCELLANEOUS) ×3 IMPLANT
GAUZE SPONGE 4X4 12PLY STRL (GAUZE/BANDAGES/DRESSINGS) ×3 IMPLANT
GLOVE INDICATOR 8.0 STRL GRN (GLOVE) ×3 IMPLANT
GLOVE SURG ORTHO 8.0 STRL STRW (GLOVE) ×3 IMPLANT
GOWN STRL REUS W/ TWL LRG LVL3 (GOWN DISPOSABLE) ×1 IMPLANT
GOWN STRL REUS W/ TWL XL LVL3 (GOWN DISPOSABLE) ×1 IMPLANT
GOWN STRL REUS W/TWL LRG LVL3 (GOWN DISPOSABLE) ×2
GOWN STRL REUS W/TWL XL LVL3 (GOWN DISPOSABLE) ×2
IV LACTATED RINGER IRRG 3000ML (IV SOLUTION) ×8
IV LR IRRIG 3000ML ARTHROMATIC (IV SOLUTION) ×4 IMPLANT
KIT TURNOVER KIT A (KITS) ×3 IMPLANT
MANIFOLD NEPTUNE II (INSTRUMENTS) ×3 IMPLANT
MAT BLUE FLOOR 46X72 FLO (MISCELLANEOUS) ×3 IMPLANT
NEEDLE SPNL 20GX3.5 QUINCKE YW (NEEDLE) ×3 IMPLANT
PACK ARTHROSCOPY KNEE (MISCELLANEOUS) ×3 IMPLANT
PAD ABD DERMACEA PRESS 5X9 (GAUZE/BANDAGES/DRESSINGS) ×6 IMPLANT
PAD WRAPON POLAR KNEE (MISCELLANEOUS) IMPLANT
SUT ETHILON 4-0 (SUTURE) ×2
SUT ETHILON 4-0 FS2 18XMFL BLK (SUTURE) ×1
SUTURE ETHLN 4-0 FS2 18XMF BLK (SUTURE) ×1 IMPLANT
SYR 10ML 18GX1 1/2 (NEEDLE) ×3 IMPLANT
TUBING ARTHRO INFLOW-ONLY STRL (TUBING) ×3 IMPLANT
WAND HAND CNTRL MULTIVAC 90 (MISCELLANEOUS) IMPLANT
WRAPON POLAR PAD KNEE (MISCELLANEOUS)

## 2017-05-04 NOTE — Transfer of Care (Signed)
Immediate Anesthesia Transfer of Care Note  Patient: Shaun Howe  Procedure(s) Performed: KNEE ARTHROSCOPY WITH PARTIAL MEDIAL MENISECTOMY, AND PARTIAL SYNOVECTOMY (Left Knee)  Patient Location: PACU  Anesthesia Type:General  Level of Consciousness: awake  Airway & Oxygen Therapy: Patient Spontanous Breathing  Post-op Assessment: Report given to RN  Post vital signs: Reviewed  Last Vitals:  Vitals:   05/04/17 0906 05/04/17 1155  BP: 137/83 126/89  Pulse: (!) 55 95  Resp: 17 16  Temp: (!) 36.3 C 36.4 C  SpO2: 100% 100%    Last Pain:  Vitals:   05/04/17 1155  TempSrc:   PainSc: Asleep         Complications: No apparent anesthesia complications

## 2017-05-04 NOTE — Discharge Instructions (Addendum)
Post Op Home Instructions for Knee Arthroscopy ° °1) Do not sit for longer than 1 hour at a time with your leg dangling down.  You should have your legs elevated (higher than your heart) in a recliner chair or couch. ° °2) You may be up walking around as tolerated but should take periodic breaks to elevate your legs.  Discontinue use of crutches when you feel you are able to walk without pain or a limp. ° °3) Work on gentle bending and straightening of the knee. ° °4) You may remove the Ace wrap and dressings two days after surgery.  Place band aids over the incision sites. ° °5) You may shower after you remove the surgical dressing.  You do not need to cover the incision with plastic wrap.  The incision can get wet, but do not submerge under water.  After your sutures have been removed, you should wait 24 hours before submerging incision under water. ° °6) Pain medication can cause constipation.  You should increase your fluid intake, increase your intake of high fiber foods and/or take Metamucil as needed for constipation. ° °7) Continue your physical therapy exercises, as shown at the office, at least twice daily.  You should set up outpatient physical therapy and start within the first week after surgery. ° °8) Continue to use your Polar Pack continuously for 2-3 days after surgery.  After you remove the surgical dressing, it is a good idea to use your Polar Pack or ice pack for 30 minutes after doing your exercises to reduce swelling. ° °9) Do not be surprised if you have increased pain at night.  This usually means you have been a little too active during the day and need to reduce your activities. ° °10) If you develop lower extremity swelling that does not improve after a night of elevation, please call the office.  This could be an early sign of a blood clot. ° °Please call with any questions at [phone] ° ° °AMBULATORY SURGERY  °DISCHARGE INSTRUCTIONS ° ° °1) The drugs that you were given will stay in  your system until tomorrow so for the next 24 hours you should not: ° °A) Drive an automobile °B) Make any legal decisions °C) Drink any alcoholic beverage ° ° °2) You may resume regular meals tomorrow.  Today it is better to start with liquids and gradually work up to solid foods. ° °You may eat anything you prefer, but it is better to start with liquids, then soup and crackers, and gradually work up to solid foods. ° ° °3) Please notify your doctor immediately if you have any unusual bleeding, trouble breathing, redness and pain at the surgery site, drainage, fever, or pain not relieved by medication. ° ° ° °4) Additional Instructions: ° ° ° ° ° ° ° °Please contact your physician with any problems or Same Day Surgery at [phone], Monday through Friday 6 am to 4 pm, or [place] at  Main number at [phone]. °

## 2017-05-04 NOTE — Anesthesia Procedure Notes (Signed)
Procedure Name: LMA Insertion Date/Time: 05/04/2017 10:56 AM Performed by: Deri FuellingPrivette, Jordon Kristiansen, CRNA Pre-anesthesia Checklist: Patient identified, Emergency Drugs available, Suction available, Patient being monitored and Timeout performed Patient Re-evaluated:Patient Re-evaluated prior to induction Oxygen Delivery Method: Circle system utilized Preoxygenation: Pre-oxygenation with 100% oxygen Induction Type: IV induction Ventilation: Mask ventilation without difficulty LMA: LMA inserted LMA Size: 4.0 Number of attempts: 1 Placement Confirmation: breath sounds checked- equal and bilateral,  CO2 detector and positive ETCO2 Dental Injury: Teeth and Oropharynx as per pre-operative assessment

## 2017-05-04 NOTE — Anesthesia Preprocedure Evaluation (Signed)
Anesthesia Evaluation  Patient identified by MRN, date of birth, ID band Patient awake    Reviewed: Allergy & Precautions, NPO status , Patient's Chart, lab work & pertinent test results  History of Anesthesia Complications Negative for: history of anesthetic complications  Airway Mallampati: I  TM Distance: >3 FB Neck ROM: Full    Dental  (+) Dental Advidsory Given, Teeth Intact, Chipped   Pulmonary neg pulmonary ROS, former smoker,           Cardiovascular Exercise Tolerance: Good hypertension, (-) angina(-) CAD, (-) Past MI, (-) Cardiac Stents and (-) CABG (-) dysrhythmias (-) Valvular Problems/Murmurs     Neuro/Psych Anxiety negative neurological ROS     GI/Hepatic Neg liver ROS, GERD  ,  Endo/Other  negative endocrine ROS  Renal/GU negative Renal ROS     Musculoskeletal   Abdominal   Peds  Hematology negative hematology ROS (+)   Anesthesia Other Findings Past Medical History: No date: Anxiety     Comment:  and panic disorder No date: GERD (gastroesophageal reflux disease) No date: Hypercholesteremia No date: Hypertension No date: Psoriasis No date: 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 No date: Tuberculosis   Reproductive/Obstetrics                             Anesthesia Physical  Anesthesia Plan  ASA: II  Anesthesia Plan: General   Post-op Pain Management:    Induction: Intravenous  PONV Risk Score and Plan: 2 and Ondansetron and Dexamethasone  Airway Management Planned: LMA  Additional Equipment:   Intra-op Plan:   Post-operative Plan: Extubation in OR  Informed Consent: I have reviewed the patients History and Physical, chart, labs and discussed the procedure including the risks, benefits and alternatives for the proposed anesthesia with the patient or authorized representative who has indicated his/her understanding and  acceptance.     Plan Discussed with: CRNA  Anesthesia Plan Comments:         Anesthesia Quick Evaluation

## 2017-05-04 NOTE — Anesthesia Post-op Follow-up Note (Signed)
Anesthesia QCDR form completed.        

## 2017-05-04 NOTE — Op Note (Signed)
  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 male with a diagnosis of TEAR OF MEDIAL MENISCUS who failed conservative management and elected for surgical management.    The risks benefits and alternatives were discussed with the patient preoperatively including the risks of infection, bleeding, nerve injury, knee stiffness, persistent pain, osteoarthritis and the need for further surgery. Medical  risks include DVT and pulmonary embolism, myocardial infarction, stroke, pneumonia, respiratory failure and death. The patient understood these risks and wished to proceed.   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 ACL and PCL were intact. There was no subluxation of the patella. No loose bodies were identified within the medial or lateral gutters.  OPERATIVE PROCEDURE: Patient was met in the preoperative area. The operative extremity was signed with my initials according the hospital's correct site of surgery protocol.  The patient was brought to the operating room where they was placed supine on the operative table. General anesthesia was administered. The patient was prepped and draped in a sterile fashion.  A timeout was performed to verify the patient's name, date of birth, medical record number, correct site of surgery correct procedure to be performed. It was also used to verify the patient received antibiotics that all  appropriate instruments, and radiographic studies were available in the room. Once all in attendance were in agreement, the case began.  Proposed arthroscopy incisions were drawn out with a surgical marker. These were pre-injected with 0.5% marcaine with epinephrine. An 11 blade was used to establish an inferior lateral and inferomedial portals. The inferomedial portal was created using a 18-gauge spinal needle under direct visualization.  A full diagnostic examination of the knee was performed including the suprapatellar pouch, patellofemoral joint, medial lateral compartments as well as the medial lateral gutters, the intercondylar notch in the posterior knee.  Patient had the meniscal tear treated with a 4-0 resector shaver blade and straight duckbill basket. The meniscus was debrided until a stable rim was achieved. A chondroplasty of the medial femoral condyle, trochlea and undersurface of patella was also performed using a 4-0 resector shaver blade. A partial synovectomy was also performed using a 4-0 resector shaver blade.  The knee was then copiously lavaged. All arthroscopic instruments were removed. The 2 arthroscopy portals were closed with 4-0 nylon. A dry sterile and compressive dressing was applied. The patient was brought to the PACU in stable condition. I was scrubbed and present for the entire case and all sharp and instrument counts were correct at the conclusion the case. I spoke with the patient's family postoperatively to let them know the case was performed without complication and the patient was stable in the recovery room.  Kurtis Bushman, MD

## 2017-05-04 NOTE — Anesthesia Postprocedure Evaluation (Signed)
Anesthesia Post Note  Patient: Shaun Howe  Procedure(s) Performed: KNEE ARTHROSCOPY WITH PARTIAL MEDIAL MENISECTOMY, AND PARTIAL SYNOVECTOMY (Left Knee)  Patient location during evaluation: PACU Anesthesia Type: General Level of consciousness: awake and alert Pain management: pain level controlled Vital Signs Assessment: post-procedure vital signs reviewed and stable Respiratory status: spontaneous breathing, nonlabored ventilation, respiratory function stable and patient connected to nasal cannula oxygen Cardiovascular status: blood pressure returned to baseline and stable Postop Assessment: no apparent nausea or vomiting Anesthetic complications: no     Last Vitals:  Vitals:   05/04/17 1248 05/04/17 1300  BP: 140/88 137/81  Pulse: 71 63  Resp: 20 16  Temp: (!) 36.2 C   SpO2: 97% 100%    Last Pain:  Vitals:   05/04/17 1300  TempSrc:   PainSc: 4                  Lenard SimmerAndrew Mouna Yager

## 2017-05-04 NOTE — H&P (Signed)
The patient has been re-examined, and the chart reviewed, and there have been no interval changes to the documented history and physical.  Plan a left knee arthroscopy today.  Anesthesia is not consulted regarding a peripheral nerve block for post-operative pain.  The risks, benefits, and alternatives have been discussed at length, and the patient is willing to proceed.    

## 2017-05-05 ENCOUNTER — Encounter: Payer: Self-pay | Admitting: Orthopedic Surgery

## 2021-11-08 ENCOUNTER — Other Ambulatory Visit: Payer: Self-pay | Admitting: Otolaryngology

## 2021-11-08 DIAGNOSIS — H903 Sensorineural hearing loss, bilateral: Secondary | ICD-10-CM

## 2021-11-28 ENCOUNTER — Ambulatory Visit
Admission: RE | Admit: 2021-11-28 | Discharge: 2021-11-28 | Disposition: A | Discharge status: Home / Self Care | Payer: Medicare Other | Source: Ambulatory Visit | Attending: Otolaryngology | Admitting: Otolaryngology

## 2021-11-28 DIAGNOSIS — H903 Sensorineural hearing loss, bilateral: Secondary | ICD-10-CM

## 2021-11-28 MED ORDER — GADOBENATE DIMEGLUMINE 529 MG/ML IV SOLN
20.0000 mL | Freq: Once | INTRAVENOUS | Status: AC | PRN
  Administered 2021-11-28: 20 mL via INTRAVENOUS

## 2022-05-27 ENCOUNTER — Ambulatory Visit: Payer: Medicare PPO

## 2022-05-27 DIAGNOSIS — D123 Benign neoplasm of transverse colon: Secondary | ICD-10-CM

## 2022-05-27 DIAGNOSIS — K640 First degree hemorrhoids: Secondary | ICD-10-CM

## 2022-05-27 DIAGNOSIS — R195 Other fecal abnormalities: Secondary | ICD-10-CM

## 2022-05-27 DIAGNOSIS — K621 Rectal polyp: Secondary | ICD-10-CM | POA: Diagnosis not present

## 2022-05-27 DIAGNOSIS — K5730 Diverticulosis of large intestine without perforation or abscess without bleeding: Secondary | ICD-10-CM | POA: Diagnosis not present

## 2022-05-27 DIAGNOSIS — Z1211 Encounter for screening for malignant neoplasm of colon: Secondary | ICD-10-CM

## 2022-06-30 ENCOUNTER — Ambulatory Visit: Payer: Medicare PPO | Admitting: Podiatry

## 2022-07-02 ENCOUNTER — Ambulatory Visit (INDEPENDENT_AMBULATORY_CARE_PROVIDER_SITE_OTHER): Payer: Medicare PPO

## 2022-07-02 ENCOUNTER — Ambulatory Visit: Payer: Medicare PPO | Admitting: Podiatry

## 2022-07-02 DIAGNOSIS — M722 Plantar fascial fibromatosis: Secondary | ICD-10-CM

## 2022-07-02 DIAGNOSIS — M659 Synovitis and tenosynovitis, unspecified: Secondary | ICD-10-CM

## 2022-07-02 MED ORDER — METHYLPREDNISOLONE 4 MG PO TBPK: ORAL_TABLET | ORAL | 0 refills | Status: DC

## 2022-07-02 MED ORDER — MELOXICAM 15 MG PO TABS: 15.0000 mg | ORAL_TABLET | Freq: Every day | ORAL | 3 refills | Status: DC

## 2022-07-02 NOTE — Progress Notes (Signed)
Subjective:  Patient ID: Shaun Howe, male    DOB: 04-26-1955,  MRN: [id number] HPI Chief Complaint  Patient presents with   Plantar Fasciitis    Patient came in today for right heel pain, started 2 months ago, rate of pain 7 out of 10, X-Rays done today     67 y.o. male presents with the above complaint.   ROS: Denies fever chills nausea vomit muscle aches pains calf pain back pain chest pain shortness of breath.  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   Tuberculosis    Past Surgical History:  Procedure Laterality Date   KNEE ARTHROSCOPY Right 09/27/2014   Procedure: ARTHROSCOPY KNEE WITH DEBRIDEMENT AND PARTIAL MENISECTOMY;  Surgeon: Christena Flake, MD;  Location: [facility];  Service: Orthopedics;  Laterality: Right;   KNEE ARTHROSCOPY WITH MEDIAL MENISECTOMY Left 05/04/2017   Procedure: KNEE ARTHROSCOPY WITH PARTIAL MEDIAL MENISECTOMY, AND PARTIAL SYNOVECTOMY;  Surgeon: Lyndle Herrlich, MD;  Location: [facility];  Service: Orthopedics;  Laterality: Left;   ORAL MUCOCELE EXCISION     PILONIDAL CYST EXCISION     TONSILLECTOMY     VASECTOMY      Current Outpatient Medications:    meloxicam (MOBIC) 15 MG tablet, Take 1 tablet (15 mg total) by mouth daily., Disp: 30 tablet, Rfl: 3   methylPREDNISolone (MEDROL DOSEPAK) 4 MG TBPK tablet, 6 day dose pack - take as directed, Disp: 21 tablet, Rfl: 0   acetaminophen (TYLENOL) 500 MG tablet, Take 1,000 mg by mouth 2 (two) times daily as needed for moderate pain or headache., Disp: , Rfl:    atorvastatin (LIPITOR) 40 MG tablet, Take 40 mg by mouth every morning. , Disp: , Rfl:    cetaphil (CETAPHIL) cream, Apply 1 application topically daily. After showering, Disp: , Rfl:    chlorhexidine (PERIDEX) 0.12 % solution, Use as directed 15 mLs in  the mouth or throat every other day. PM , Disp: , Rfl:    clonazePAM (KLONOPIN) 1 MG tablet, Take 1 mg by mouth 3 (three) times daily., Disp: , Rfl:    ibuprofen (ADVIL,MOTRIN) 800 MG tablet, Take 800 mg by mouth daily as needed for moderate pain., Disp: , Rfl:    lisinopril-hydrochlorothiazide (PRINZIDE,ZESTORETIC) 20-12.5 MG per tablet, Take 1 tablet by mouth daily. , Disp: , Rfl:    mirtazapine (REMERON) 15 MG tablet, Take 15 mg by mouth at bedtime., Disp: , Rfl:    ranitidine (ZANTAC) 150 MG tablet, Take 150 mg by mouth 2 (two) times daily as needed for heartburn., Disp: , Rfl:    Vilazodone HCl (VIIBRYD) 40 MG TABS, Take 40 mg by mouth every morning. , Disp: , Rfl:   No Known Allergies Review of Systems Objective:  There were no vitals filed for this visit.  General: Well developed, nourished, in no acute distress, alert and oriented x3   Dermatological: Skin is warm, dry and supple bilateral. Nails x 10 are well maintained; remaining integument appears unremarkable at this time. There are no open sores, no preulcerative lesions, no rash or signs of infection present.  Vascular: Dorsalis Pedis artery and Posterior Tibial artery pedal pulses are 2/4 bilateral with immedate capillary fill time. Pedal hair growth present. No varicosities and no lower extremity edema present bilateral.  Neruologic: Grossly intact via light touch bilateral. Vibratory intact via tuning fork bilateral. Protective threshold with Semmes Wienstein monofilament intact to all pedal sites bilateral. Patellar and Achilles deep tendon reflexes 2+ bilateral. No Babinski or clonus noted bilateral.   Musculoskeletal: No gross boney pedal deformities bilateral. No pain, crepitus, or limitation noted with foot and ankle range of motion bilateral. Muscular strength 5/5 in all groups tested bilateral.  Pain on palpation medial calcaneal tubercle of the right heel.  He has some tenderness into the posterior tibial tendon and the  peroneals.  Gait: Unassisted, Nonantalgic.    Radiographs:  Radiographs taken today demonstrate osseously mature individual soft tissue increase in density plantar fascial calcaneal insertion site of the heel.  No significant acute findings in the area of question.  Assessment & Plan:   Assessment: Planter fasciitis right with some ankle symptoms associated with this most likely.    Plan: Discussed etiology pathology conservative versus surgical therapies.  Started him on methylprednisolone to be followed by meloxicam.  Dispensed a plantar fascial brace and I like to follow-up with him in 1 month     Tawfiq Favila T. Corn, [place]

## 2022-08-06 ENCOUNTER — Encounter: Payer: Medicare PPO | Admitting: Podiatry

## 2023-07-27 ENCOUNTER — Ambulatory Visit: Admitting: Podiatry

## 2023-07-27 ENCOUNTER — Encounter: Payer: Self-pay | Admitting: Podiatry

## 2023-07-27 DIAGNOSIS — M722 Plantar fascial fibromatosis: Secondary | ICD-10-CM | POA: Diagnosis not present

## 2023-07-27 MED ORDER — METHYLPREDNISOLONE 4 MG PO TBPK: ORAL_TABLET | ORAL | 0 refills | Status: AC

## 2023-07-27 MED ORDER — MELOXICAM 15 MG PO TABS: 15.0000 mg | ORAL_TABLET | Freq: Every day | ORAL | 3 refills | Status: AC

## 2023-07-28 NOTE — Progress Notes (Signed)
 He presents today chief concern of a flareup of plantar fasciitis on the right foot.  He states that he has gotten new shoes stop walking on the treadmill uphill.  He does walk about 7 miles a week in the park for exercise and would like to consider custom built orthotics.  He states that his wife has had 2 pair of them before and loves them.  Objective: Vital signs are stable alert and oriented x 3.  Pulses are palpable.  He does have pain on palpation medial calcaneal tubercle of the right heel and no other symptoms.  No open lesions or wounds are noted.  Assessment: Plantar fasciitis right foot.  Plan: Offered him injection he declined.  Started him on methylprednisolone  to be followed by meloxicam .  Discussed appropriate shoe gear and we will schedule him with [facility] for set of orthotics for his plantar fasciitis.

## 2023-09-18 ENCOUNTER — Ambulatory Visit (INDEPENDENT_AMBULATORY_CARE_PROVIDER_SITE_OTHER)

## 2023-09-18 DIAGNOSIS — M65979 Unspecified synovitis and tenosynovitis, unspecified ankle and foot: Secondary | ICD-10-CM

## 2023-09-18 DIAGNOSIS — M722 Plantar fascial fibromatosis: Secondary | ICD-10-CM | POA: Diagnosis not present

## 2023-09-18 DIAGNOSIS — M2141 Flat foot [pes planus] (acquired), right foot: Secondary | ICD-10-CM

## 2023-09-18 NOTE — Progress Notes (Signed)
 Orthotics   Patient was present and evaluated for Custom molded foot orthotics. Patient will benefit from CFO's to provide total contact to BIL MLA's helping to balance and distribute body weight more evenly across BIL feet helping to reduce plantar pressure and pain. Orthotic will also encourage FF / RF alignment  Patient was scanned today and will return for fitting upon receipt

## 2023-09-30 ENCOUNTER — Telehealth: Payer: Self-pay

## 2023-09-30 NOTE — Telephone Encounter (Signed)
 Orthotics in burl bin

## 2023-11-13 ENCOUNTER — Ambulatory Visit

## 2023-11-16 NOTE — Progress Notes (Signed)
 Patient presents today to pick up custom molded foot orthotics, diagnosed with PF by Dr. Verta.   Orthotics were dispensed and fit was satisfactory. Reviewed instructions for break-in and wear. Written instructions given to patient.  Patient will follow up as needed.  Lolita Sheldon Merino,

## 2023-12-09 ENCOUNTER — Telehealth: Payer: Self-pay

## 2023-12-09 NOTE — Telephone Encounter (Signed)
 Patient called in reference to a bill he has for his orthotics that he has rcvd from us . He states that Lolita told him [employer] would cover 80%. He asked for the Dx code to be changed so that [facility] will cover. I told him I will give this message to Lolita and see what we can do. He would like for only Lolita to reach back out to him.   Financial form signed and on file.

## 2024-01-01 NOTE — Progress Notes (Signed)
 Removed one unit to give patient half off as we thought insurance would pay  This should give patient a credit of $271 Shaun Howe CPed
